# Patient Record
Sex: Male | Born: 1995 | Hispanic: Yes | Marital: Single | Smoking: Never smoker
Health system: Southern US, Community
[De-identification: ages and names within clinical notes are randomized; demographics above are authoritative.]

## PROBLEM LIST (undated history)

## (undated) HISTORY — PX: CIRCUMCISION: SUR203

---

## 2019-11-03 ENCOUNTER — Other Ambulatory Visit: Payer: Self-pay

## 2019-11-03 ENCOUNTER — Emergency Department (HOSPITAL_BASED_OUTPATIENT_CLINIC_OR_DEPARTMENT_OTHER)
Admission: EM | Admit: 2019-11-03 | Discharge: 2019-11-03 | Disposition: A | Discharge status: Home / Self Care | Payer: Self-pay | Attending: Emergency Medicine | Admitting: Emergency Medicine

## 2019-11-03 ENCOUNTER — Emergency Department (HOSPITAL_BASED_OUTPATIENT_CLINIC_OR_DEPARTMENT_OTHER): Payer: Self-pay

## 2019-11-03 ENCOUNTER — Encounter (HOSPITAL_BASED_OUTPATIENT_CLINIC_OR_DEPARTMENT_OTHER): Payer: Self-pay | Admitting: *Deleted

## 2019-11-03 DIAGNOSIS — Y9389 Activity, other specified: Secondary | ICD-10-CM | POA: Insufficient documentation

## 2019-11-03 DIAGNOSIS — Y999 Unspecified external cause status: Secondary | ICD-10-CM | POA: Insufficient documentation

## 2019-11-03 DIAGNOSIS — M545 Low back pain, unspecified: Secondary | ICD-10-CM

## 2019-11-03 MED ORDER — NAPROXEN 375 MG PO TABS: 375.0000 mg | ORAL_TABLET | Freq: Two times a day (BID) | ORAL | 0 refills | Status: AC

## 2019-11-03 MED ORDER — DIAZEPAM 5 MG PO TABS
5.0000 mg | ORAL_TABLET | Freq: Once | ORAL | Status: AC
  Administered 2019-11-03: 5 mg via ORAL
  Filled 2019-11-03: qty 1

## 2019-11-03 MED ORDER — NAPROXEN 250 MG PO TABS
500.0000 mg | ORAL_TABLET | Freq: Once | ORAL | Status: AC
  Administered 2019-11-03: 500 mg via ORAL
  Filled 2019-11-03: qty 2

## 2019-11-03 MED ORDER — CYCLOBENZAPRINE HCL 10 MG PO TABS: 10.0000 mg | ORAL_TABLET | Freq: Two times a day (BID) | ORAL | 0 refills | Status: AC

## 2019-11-03 MED FILL — NAPROXEN 375 MG TABLET: 375 | 7 days supply | Qty: 14 | Fill #0

## 2019-11-03 MED FILL — CYCLOBENZAPRINE HCL 10 MG T: 10 | 7 days supply | Qty: 14 | Fill #0
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
## 2019-11-03 NOTE — ED Notes (Signed)
ED Provider at bedside. 
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
## 2019-11-03 NOTE — Discharge Instructions (Signed)
Los [employer] de los rayos X fueron normales. Le he recetado medicina para Chief Technology Officer, tome [employer] al dia. Esta medicina puede causar mareos, no tome alcol ni maneja cuando tome [employer].   La segunda medicina es un antiinflamatorio, tome 1 tableta dos veces al dia para ayudar con Chief Technology Officer. Puede ponerse hielo o algo caliente para ayudar con sus simptomas.
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
